# Patient Record
Sex: Male | Born: 1980 | Race: White | Hispanic: No | Marital: Married | Smoking: Never smoker
Health system: Southern US, Community
[De-identification: ages and names within clinical notes are randomized; demographics above are authoritative.]

## PROBLEM LIST (undated history)

## (undated) DIAGNOSIS — J45909 Unspecified asthma, uncomplicated: Secondary | ICD-10-CM

---

## 2011-11-06 ENCOUNTER — Emergency Department: Payer: Self-pay | Admitting: Emergency Medicine

## 2011-11-06 LAB — BASIC METABOLIC PANEL
Anion Gap: 9 (ref 7–16)
BUN: 17 mg/dL (ref 7–18)
Chloride: 105 mmol/L (ref 98–107)
Co2: 26 mmol/L (ref 21–32)
Creatinine: 1.04 mg/dL (ref 0.60–1.30)
EGFR (African American): >60
Glucose: 83 mg/dL (ref 65–99)
Potassium: 3.9 mmol/L (ref 3.5–5.1)

## 2011-11-06 LAB — CBC
HCT: 44.3 % (ref 40.0–52.0)
HGB: 15.2 g/dL (ref 13.0–18.0)
MCH: 30.7 pg (ref 26.0–34.0)
MCHC: 34.4 g/dL (ref 32.0–36.0)
MCV: 89 fL (ref 80–100)
Platelet: 228 10*3/uL (ref 150–440)
RDW: 12.7 % (ref 11.5–14.5)

## 2013-02-15 ENCOUNTER — Emergency Department: Payer: Self-pay | Admitting: Emergency Medicine

## 2013-02-15 LAB — COMPREHENSIVE METABOLIC PANEL
ALK PHOS: 119 U/L — AB
Albumin: 3.9 g/dL (ref 3.4–5.0)
Anion Gap: 4 — ABNORMAL LOW (ref 7–16)
BUN: 15 mg/dL (ref 7–18)
Bilirubin,Total: 0.4 mg/dL (ref 0.2–1.0)
CHLORIDE: 101 mmol/L (ref 98–107)
CO2: 26 mmol/L (ref 21–32)
Calcium, Total: 8.9 mg/dL (ref 8.5–10.1)
Creatinine: 0.75 mg/dL (ref 0.60–1.30)
EGFR (African American): >60
EGFR (Non-African Amer.): >60
GLUCOSE: 99 mg/dL (ref 65–99)
OSMOLALITY: 264 (ref 275–301)
POTASSIUM: 4 mmol/L (ref 3.5–5.1)
SGOT(AST): 44 U/L — ABNORMAL HIGH (ref 15–37)
SGPT (ALT): 55 U/L (ref 12–78)
SODIUM: 131 mmol/L — AB (ref 136–145)
TOTAL PROTEIN: 7.9 g/dL (ref 6.4–8.2)

## 2013-02-15 LAB — CBC WITH DIFFERENTIAL/PLATELET
Basophil #: 0 10*3/uL (ref 0.0–0.1)
Basophil %: 0.4 %
Eosinophil #: 0.1 10*3/uL (ref 0.0–0.7)
Eosinophil %: 1 %
HCT: 45.9 % (ref 40.0–52.0)
HGB: 15.8 g/dL (ref 13.0–18.0)
LYMPHS PCT: 30 %
Lymphocyte #: 2.7 10*3/uL (ref 1.0–3.6)
MCH: 30 pg (ref 26.0–34.0)
MCHC: 34.4 g/dL (ref 32.0–36.0)
MCV: 87 fL (ref 80–100)
MONO ABS: 0.5 x10 3/mm (ref 0.2–1.0)
Monocyte %: 5.6 %
Neutrophil #: 5.7 10*3/uL (ref 1.4–6.5)
Neutrophil %: 63 %
PLATELETS: 237 10*3/uL (ref 150–440)
RBC: 5.26 10*6/uL (ref 4.40–5.90)
RDW: 12.8 % (ref 11.5–14.5)
WBC: 9 10*3/uL (ref 3.8–10.6)

## 2013-02-15 LAB — URINALYSIS, COMPLETE
BACTERIA: NONE SEEN
BILIRUBIN, UR: NEGATIVE
Blood: NEGATIVE
Glucose,UR: NEGATIVE mg/dL (ref 0–75)
KETONE: NEGATIVE
Leukocyte Esterase: NEGATIVE
NITRITE: NEGATIVE
PROTEIN: NEGATIVE
Ph: 6 (ref 4.5–8.0)
SPECIFIC GRAVITY: 1.023 (ref 1.003–1.030)
SQUAMOUS EPITHELIAL: NONE SEEN

## 2013-02-15 LAB — LIPASE, BLOOD: LIPASE: 113 U/L (ref 73–393)

## 2016-01-15 DIAGNOSIS — Y9389 Activity, other specified: Secondary | ICD-10-CM | POA: Diagnosis not present

## 2016-01-15 DIAGNOSIS — Z23 Encounter for immunization: Secondary | ICD-10-CM | POA: Insufficient documentation

## 2016-01-15 DIAGNOSIS — S61237A Puncture wound without foreign body of left little finger without damage to nail, initial encounter: Secondary | ICD-10-CM | POA: Diagnosis not present

## 2016-01-15 DIAGNOSIS — Y929 Unspecified place or not applicable: Secondary | ICD-10-CM | POA: Diagnosis not present

## 2016-01-15 DIAGNOSIS — Y990 Civilian activity done for income or pay: Secondary | ICD-10-CM | POA: Insufficient documentation

## 2016-01-15 DIAGNOSIS — W311XXA Contact with metalworking machines, initial encounter: Secondary | ICD-10-CM | POA: Diagnosis not present

## 2016-01-15 DIAGNOSIS — S6992XA Unspecified injury of left wrist, hand and finger(s), initial encounter: Secondary | ICD-10-CM | POA: Diagnosis present

## 2016-01-16 ENCOUNTER — Emergency Department: Payer: Worker's Compensation

## 2016-01-16 ENCOUNTER — Encounter: Payer: Self-pay | Admitting: Emergency Medicine

## 2016-01-16 ENCOUNTER — Emergency Department
Admission: EM | Admit: 2016-01-16 | Discharge: 2016-01-16 | Disposition: A | Discharge status: Home / Self Care | Payer: Worker's Compensation | Attending: Emergency Medicine | Admitting: Emergency Medicine

## 2016-01-16 DIAGNOSIS — T1490XA Injury, unspecified, initial encounter: Secondary | ICD-10-CM

## 2016-01-16 DIAGNOSIS — T148XXA Other injury of unspecified body region, initial encounter: Secondary | ICD-10-CM

## 2016-01-16 DIAGNOSIS — S6992XA Unspecified injury of left wrist, hand and finger(s), initial encounter: Secondary | ICD-10-CM

## 2016-01-16 MED ORDER — CLINDAMYCIN HCL 300 MG PO CAPS: 300.0000 mg | ORAL_CAPSULE | Freq: Three times a day (TID) | ORAL | 0 refills | Status: AC

## 2016-01-16 MED ORDER — TETANUS-DIPHTH-ACELL PERTUSSIS 5-2.5-18.5 LF-MCG/0.5 IM SUSP
0.5000 mL | Freq: Once | INTRAMUSCULAR | Status: AC
  Administered 2016-01-16: 0.5 mL via INTRAMUSCULAR
  Filled 2016-01-16: qty 0.5

## 2016-01-16 MED ORDER — CLINDAMYCIN HCL 150 MG PO CAPS
300.0000 mg | ORAL_CAPSULE | Freq: Once | ORAL | Status: AC
  Administered 2016-01-16: 300 mg via ORAL
  Filled 2016-01-16: qty 2

## 2016-01-16 MED ORDER — IBUPROFEN 600 MG PO TABS
600.0000 mg | ORAL_TABLET | Freq: Once | ORAL | Status: AC
  Administered 2016-01-16: 600 mg via ORAL
  Filled 2016-01-16: qty 1

## 2016-01-16 MED ORDER — TRAMADOL HCL 50 MG PO TABS: 50.0000 mg | ORAL_TABLET | Freq: Four times a day (QID) | ORAL | 0 refills | Status: AC | PRN

## 2016-01-16 NOTE — ED Triage Notes (Signed)
Patient ambulatory to triage with steady gait, without difficulty or distress noted; pt reports PTA while at work, drill bit went thru left 5th digit; employeed with [employer]; workers comp profile indicates UDS required; [place], EDT to triage to complete profile

## 2016-01-16 NOTE — ED Provider Notes (Signed)
[facility] Emergency Department Provider Note   ____________________________________________   First MD Initiated Contact with Patient 01/16/16 0130     (approximate)  I have reviewed the triage vital signs and the nursing notes.   HISTORY  Chief Complaint Finger Injury    HPI Gerald Herrera is a 35 y.o. male who comes into the hospital today with injury to his finger. The patient reports that he put a drill bit into his finger. He was drilling a piece of metal at work and he thought his finger was out of the way. The patient reports that he made a mistake. He rates his pain a 7 out of 10 in intensity. The injuries to his left fifth digit. He does have some numbness and tingling and did not take anything for pain prior to arrival. He is here for evaluation.   History reviewed. No pertinent past medical history.  There are no active problems to display for this patient.   History reviewed. No pertinent surgical history.  Prior to Admission medications   Medication Sig Start Date End Date Taking? Authorizing Provider  clindamycin (CLEOCIN) 300 MG capsule Take 1 capsule (300 mg total) by mouth 3 (three) times daily. 01/16/16 01/26/16  Rebecka ApleyAllison P Ailen Strauch, MD  traMADol (ULTRAM) 50 MG tablet Take 1 tablet (50 mg total) by mouth every 6 (six) hours as needed. 01/16/16   Rebecka ApleyAllison P Kaisa Wofford, MD    Allergies Patient has no known allergies.  No family history on file.  Social History Social History  Substance Use Topics  . Smoking status: Never Smoker  . Smokeless tobacco: Never Used  . Alcohol use No    Review of Systems Constitutional: No fever/chills Eyes: No visual changes. ENT: No sore throat. Cardiovascular: Denies chest pain. Respiratory: Denies shortness of breath. Gastrointestinal: No abdominal pain.  No nausea, no vomiting.  No diarrhea.  No constipation. Genitourinary: Negative for dysuria. Musculoskeletal: left 5th digit pain and  injury Skin: Negative for rash. Neurological: Negative for headaches, focal weakness or numbness.  10-point ROS otherwise negative.  ____________________________________________   PHYSICAL EXAM:  VITAL SIGNS: ED Triage Vitals  Enc Vitals Group     BP 01/16/16 0000 140/80     Pulse Rate 01/16/16 0000 84     Resp 01/16/16 0000 20     Temp 01/16/16 0000 98.8 F (37.1 C)     Temp Source 01/16/16 0000 Oral     SpO2 01/16/16 0000 96 %     Weight 01/16/16 0001 250 lb (113.4 kg)     Height 01/16/16 0001 5\' 6"  (1.676 m)     Head Circumference --      Peak Flow --      Pain Score 01/16/16 0005 7     Pain Loc --      Pain Edu? --      Excl. in GC? --     Constitutional: Alert and oriented. Well appearing and in mild distress. Eyes: Conjunctivae are normal. PERRL. EOMI. Head: Atraumatic. Nose: No congestion/rhinnorhea. Mouth/Throat: Mucous membranes are moist.  Oropharynx non-erythematous. Cardiovascular: Normal rate, regular rhythm. Grossly normal heart sounds.  Good peripheral circulation. Respiratory: Normal respiratory effort.  No retractions. Lungs CTAB. Gastrointestinal: Soft and nontender. No distention. Positive bowel sounds Musculoskeletal: No lower extremity tenderness nor edema.  Moderate size puncture to mid 5th digit Neurologic:  Normal speech and language.  Skin:  Skin is warm, dry and intact.  Psychiatric: Mood and affect are normal.   ____________________________________________  LABS (all labs ordered are listed, but only abnormal results are displayed)  Labs Reviewed - No data to display ____________________________________________  EKG  none ____________________________________________  RADIOLOGY  Left finger xray ____________________________________________   PROCEDURES  Procedure(s) performed: None  Procedures  Critical Care performed: No  ____________________________________________   INITIAL IMPRESSION / ASSESSMENT AND PLAN / ED  COURSE  Pertinent labs & imaging results that were available during my care of the patient were reviewed by me and considered in my medical decision making (see chart for details).  This is a 35 year old male who comes into the hospital today after drilling his finger at work. As it is a puncture wound we'll wash it out but we will not suture it. The patient's tetanus is out of date so I will give him a tetanus shot. I will also give the patient some ibuprofen. Given again that this is a puncture wound allergies the patient with clindamycin and have her follow-up with his primary care physician. Patient has no further questions or concerns and he will be discharged.  Clinical Course as of Jan 16 616  Wed Jan 16, 2016  0105 Negative. DG Finger Little Left [AW]    Clinical Course User Index [AW] Rebecka ApleyAllison P Amaiah Cristiano, MD     ____________________________________________   FINAL CLINICAL IMPRESSION(S) / ED DIAGNOSES  Final diagnoses:  Injury  Injury of finger of left hand, initial encounter  Puncture wound      NEW MEDICATIONS STARTED DURING THIS VISIT:  Discharge Medication List as of 01/16/2016  2:54 AM    START taking these medications   Details  clindamycin (CLEOCIN) 300 MG capsule Take 1 capsule (300 mg total) by mouth 3 (three) times daily., Starting Wed 01/16/2016, Until Sat 01/26/2016, Print    traMADol (ULTRAM) 50 MG tablet Take 1 tablet (50 mg total) by mouth every 6 (six) hours as needed., Starting Wed 01/16/2016, Print         Note:  This document was prepared using Dragon voice recognition software and may include unintentional dictation errors.    Rebecka ApleyAllison P Acie Custis, MD 01/16/16 [phone]

## 2016-01-16 NOTE — Discharge Instructions (Signed)
Please monitor the wound for signs of infection such as redness, drainage, increased pain. Return with any of these signs and symptoms. Please otherwise follow-up with the acute care clinic.

## 2016-07-31 ENCOUNTER — Emergency Department
Admission: EM | Admit: 2016-07-31 | Discharge: 2016-07-31 | Disposition: A | Discharge status: Home / Self Care | Payer: Managed Care, Other (non HMO) | Attending: Emergency Medicine | Admitting: Emergency Medicine

## 2016-07-31 ENCOUNTER — Emergency Department: Payer: Managed Care, Other (non HMO)

## 2016-07-31 DIAGNOSIS — R109 Unspecified abdominal pain: Secondary | ICD-10-CM | POA: Diagnosis not present

## 2016-07-31 DIAGNOSIS — J45909 Unspecified asthma, uncomplicated: Secondary | ICD-10-CM | POA: Insufficient documentation

## 2016-07-31 DIAGNOSIS — Z79899 Other long term (current) drug therapy: Secondary | ICD-10-CM | POA: Insufficient documentation

## 2016-07-31 DIAGNOSIS — R079 Chest pain, unspecified: Secondary | ICD-10-CM | POA: Diagnosis present

## 2016-07-31 HISTORY — DX: Unspecified asthma, uncomplicated: J45.909

## 2016-07-31 LAB — BASIC METABOLIC PANEL
Anion gap: 5 (ref 5–15)
BUN: 22 mg/dL — AB (ref 6–20)
CALCIUM: 8.8 mg/dL — AB (ref 8.9–10.3)
CHLORIDE: 107 mmol/L (ref 101–111)
CO2: 22 mmol/L (ref 22–32)
CREATININE: 0.8 mg/dL (ref 0.61–1.24)
GFR calc Af Amer: >60 mL/min (ref >60)
Glucose, Bld: 98 mg/dL (ref 65–99)
Potassium: 4.5 mmol/L (ref 3.5–5.1)
SODIUM: 134 mmol/L — AB (ref 135–145)

## 2016-07-31 LAB — CBC
HCT: 42.4 % (ref 40.0–52.0)
Hemoglobin: 14.7 g/dL (ref 13.0–18.0)
MCH: 30.4 pg (ref 26.0–34.0)
MCHC: 34.7 g/dL (ref 32.0–36.0)
MCV: 87.7 fL (ref 80.0–100.0)
Platelets: 203 10*3/uL (ref 150–440)
RBC: 4.83 MIL/uL (ref 4.40–5.90)
RDW: 13.2 % (ref 11.5–14.5)
WBC: 7.1 10*3/uL (ref 3.8–10.6)

## 2016-07-31 LAB — TROPONIN I: Troponin I: <0.03 ng/mL (ref <0.03)

## 2016-07-31 MED ORDER — MORPHINE SULFATE (PF) 4 MG/ML IV SOLN
4.0000 mg | Freq: Once | INTRAVENOUS | Status: AC
  Administered 2016-07-31: 4 mg via INTRAVENOUS
  Filled 2016-07-31: qty 1

## 2016-07-31 MED ORDER — IOPAMIDOL (ISOVUE-370) INJECTION 76%
100.0000 mL | Freq: Once | INTRAVENOUS | Status: AC | PRN
  Administered 2016-07-31: 100 mL via INTRAVENOUS

## 2016-07-31 MED ORDER — ONDANSETRON HCL 4 MG/2ML IJ SOLN
4.0000 mg | Freq: Once | INTRAMUSCULAR | Status: AC
  Administered 2016-07-31: 4 mg via INTRAVENOUS
  Filled 2016-07-31: qty 2

## 2016-07-31 NOTE — ED Notes (Signed)
Pt alert and oriented X4, active, cooperative, pt in NAD. RR even and unlabored, color WNL.  Pt informed to return if any life threatening symptoms occur.   

## 2016-07-31 NOTE — Discharge Instructions (Addendum)
You were evaluated for chest discomfort, and as we discussed although no certain cause was found I am most suspicious of likely musculoskeletal chest wall pain.  You may continue to take ibuprofen 800 mg every 8 hours as needed for discomfort. Return to the emergency department immediately for any worsening chest pain, especially any associated nausea, sweats, dizziness, weakness, numbness, trouble breathing, passing out, or any other symptoms concerning to you.  I am recommending he follow-up with primary care doctor as well as cardiologist.

## 2016-07-31 NOTE — ED Notes (Signed)
Pt reports that he developed chest pain to center of chest yesterday while at work. Pt was not doing anything abnormal while at work when chest pain developed. Describes pain as ripping pain. When pain initially started he reports diaphoresis and fatigue. Pain continues but intermittently gets 9/10. Pt alert and oriented X4, active, cooperative, pt in NAD. RR even and unlabored, color WNL.

## 2016-07-31 NOTE — ED Triage Notes (Signed)
Pt arrived via POV - drove himself to the ED. Pt reports chest pain that started yesterday afternoon while he was working in the heat. States he was doing anything strenuous at the time. States he feels a pulling sensation in his chest that radiates to the right shoulder.  Also c/o feeling weak and lightheaded. Pt reports the pain worsened overnight.

## 2016-07-31 NOTE — ED Provider Notes (Signed)
[facility] Emergency Department Provider Note ____________________________________________   I have reviewed the triage vital signs and the triage nursing note.  HISTORY  Chief Complaint Chest Pain   Historian Patient  HPI Gerald Herrera is a 36 y.o. male without significant known past medical history, presents with right-sided chest discomfort that started yesterday late afternoon at work, nonexertional. No known traumatic or overuse injury. He's never had this before. Pain has been somewhat gradual onset to maximal intensity overnight when he states the pain was at 1.26 out of 10. It's been there at a constant low level waxing and waning from there. Currently about 9 out of 10. Denies nausea or vomiting or diarrhea. Denies abdominal pain. Denies coughing or trouble breathing. States the pain was a little worse when lying flat. Denies numbness or weakness or pain into the arm or jaw.  No known cardiac history. No confusion altered mental status. No neurologic symptoms.    Past Medical History:  Diagnosis Date  . Asthma     There are no active problems to display for this patient.   No past surgical history on file.  Prior to Admission medications   Medication Sig Start Date End Date Taking? Authorizing Provider  traMADol (ULTRAM) 50 MG tablet Take 1 tablet (50 mg total) by mouth every 6 (six) hours as needed. 01/16/16   Rebecka Apley, MD    No Known Allergies  No family history on file.  Social History Social History  Substance Use Topics  . Smoking status: Never Smoker  . Smokeless tobacco: Never Used  . Alcohol use No    Review of Systems  Constitutional: Negative for fever. Eyes: Negative for visual changes. ENT: Negative for sore throat. Cardiovascular: Positive for chest pain. Respiratory: Negative for shortness of breath. Gastrointestinal: Negative for abdominal pain, vomiting and diarrhea. Genitourinary: Negative for  dysuria. Musculoskeletal: Negative for back pain. Skin: Negative for rash. Neurological: Negative for headache.  ____________________________________________   PHYSICAL EXAM:  VITAL SIGNS: ED Triage Vitals  Enc Vitals Group     BP 07/31/16 1014 (!) 109/50     Pulse Rate 07/31/16 1014 70     Resp 07/31/16 1014 18     Temp 07/31/16 1014 97.7 F (36.5 C)     Temp Source 07/31/16 1014 Oral     SpO2 07/31/16 1014 98 %     Weight 07/31/16 1015 240 lb (108.9 kg)     Height 07/31/16 1015 5\' 7"  (1.702 m)     Head Circumference --      Peak Flow --      Pain Score 07/31/16 1014 7     Pain Loc --      Pain Edu? --      Excl. in GC? --      Constitutional: Alert and oriented. Well appearing and in no distress. HEENT   Head: Normocephalic and atraumatic.      Eyes: Conjunctivae are normal. Pupils equal and round.       Ears:         Nose: No congestion/rhinnorhea.   Mouth/Throat: Mucous membranes are moist.   Neck: No stridor. Cardiovascular/Chest: Normal rate, regular rhythm.  No murmurs, rubs, or gallops.  Right pectoral muscle is somewhat tender to palpation. Respiratory: Normal respiratory effort without tachypnea nor retractions. Breath sounds are clear and equal bilaterally. No wheezes/rales/rhonchi. Gastrointestinal: Soft. No distention, no guarding, no rebound. Nontender.    Genitourinary/rectal:Deferred Musculoskeletal: Nontender with normal range of motion in all  extremities. No joint effusions.  No lower extremity tenderness.  No edema. Neurologic:  Normal speech and language. No gross or focal neurologic deficits are appreciated. Skin:  Skin is warm, dry and intact. No rash noted. Psychiatric: Mood and affect are normal. Speech and behavior are normal. Patient exhibits appropriate insight and judgment.   ____________________________________________  LABS (pertinent positives/negatives)  Labs Reviewed  BASIC METABOLIC PANEL - Abnormal; Notable for the  following:       Result Value   Sodium 134 (*)    BUN 22 (*)    Calcium 8.8 (*)    All other components within normal limits  CBC  TROPONIN I    ____________________________________________    EKG I, Governor Rooksebecca Sreshta Cressler, MD, the attending physician have personally viewed and interpreted all ECGs.  76 bpm. Normal sinus rhythm. Narrow QRS. Normal axis. Normal ST and T-wave. No S1 q3 T3. ____________________________________________  RADIOLOGY All Xrays were viewed by me. Imaging interpreted by Radiologist.  Chest x-ray two-view: No active cardiopulmonary disease.  CT angios chest abdomen pelvis:  IMPRESSION: CT angiogram chest: No thoracic aortic aneurysm or dissection. No pulmonary embolus evident. Lungs clear. No evident adenopathy.  CT angiogram abdomen and CT angiogram pelvis: No aneurysm or dissection involving the aorta, major mesenteric, and major pelvic arterial vascular structures. No appreciable atherosclerotic change in the of these vessels.  No bowel wall thickening or bowel obstruction. No abscess. Appendix appears normal. No renal or ureteral calculus. No hydronephrosis.  Small ventral hernia containing only fat.  __________________________________________  PROCEDURES  Procedure(s) performed: None  Critical Care performed: None  ____________________________________________   ED COURSE / ASSESSMENT AND PLAN  Pertinent labs & imaging results that were available during my care of the patient were reviewed by me and considered in my medical decision making (see chart for details).   [name] is here for right-sided chest discomfort, without associated symptoms, but fairly significant in intensity. His EKG and troponin are negative and reassuring and I'm less suspicious for ACS in this 36 year old with a reassuring workup, and some evidence for musculoskeletal source given tenderness to palpation of his right pectoral muscle, however I am going to go  ahead and refer him for cardiology follow-up.  We discussed given the persistent severe discomfort, risk versus benefit for evaluation of the aorta with CT scan and chose to proceed in shared decision-making.   CT is reassuring for no emergency medical condition. It patient okay for discharge home at this point after speaking with the cardiologist on call, I'm most suspicious about chest wall pain, but am still going to refer him.    CONSULTATIONS: I spoke with cardiologist, Dr. Juliann Paresallwood who recommends outpatient follow-up and is happy to see him in clinic.   Patient / Family / Caregiver informed of clinical course, medical decision-making process, and agree with plan.   I discussed return precautions, follow-up instructions, and discharge instructions with patient and/or family.  Discharge Instructions : You were evaluated for chest discomfort, and as we discussed although no certain cause was found I am most suspicious of likely musculoskeletal chest wall pain.  You may continue to take ibuprofen 800 mg every 8 hours as needed for discomfort. Return to the emergency department immediately for any worsening chest pain, especially any associated nausea, sweats, dizziness, weakness, numbness, trouble breathing, passing out, or any other symptoms concerning to you.  I am recommending he follow-up with primary care doctor as well as cardiologist.  ___________________________________________   FINAL CLINICAL IMPRESSION(S) /  ED DIAGNOSES   Final diagnoses:  Nonspecific chest pain              Note: This dictation was prepared with Dragon dictation. Any transcriptional errors that result from this process are unintentional    Governor Rooks, MD 07/31/16 1539

## 2017-10-26 IMAGING — CT CT ANGIO CHEST-ABD-PELV FOR DISSECTION W/ AND WO/W CM
2 of 7 series · 13 of 46 positions shown, 15 images · IV contrast (APPLIED)
Comparison: Chest CT November 06, 2011; CT abdomen and pelvis
February 16, 2013; chest radiograph July 31, 2016

CLINICAL DATA: Chest and abdominal pain

EXAM:
CT ANGIOGRAPHY CHEST, ABDOMEN AND PELVIS
TECHNIQUE: Initially, axial CT images were obtained through the chest without
intravenous contrast material administration. Multidetector CT
imaging through the chest, abdomen and pelvis was performed using
the standard protocol during bolus administration of intravenous
contrast. Multiplanar reconstructed images and MIPs were obtained
and reviewed to evaluate the vascular anatomy.
CONTRAST:  100 mL Isovue 370 nonionic

[Series 5: axial arterial · axial · arterial · 0.75mm/px · z∈[-1020,-423]mm · 10 of 223 slices shown, 12 images]
[im 12/223  soft-tissue]
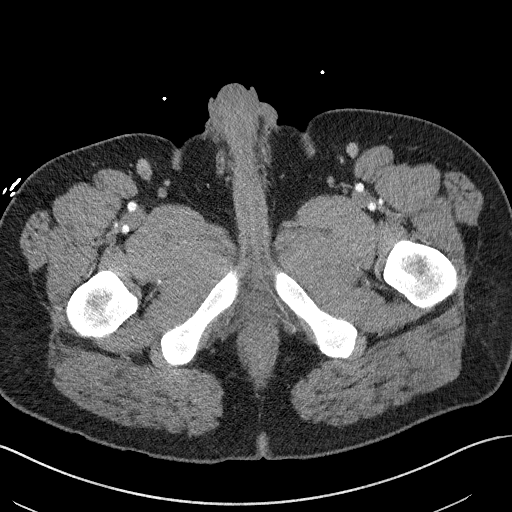
[im 12/223  bone]
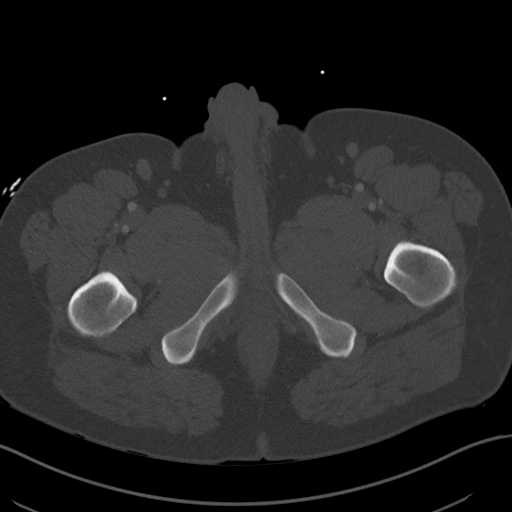
[im 34/223  soft-tissue]
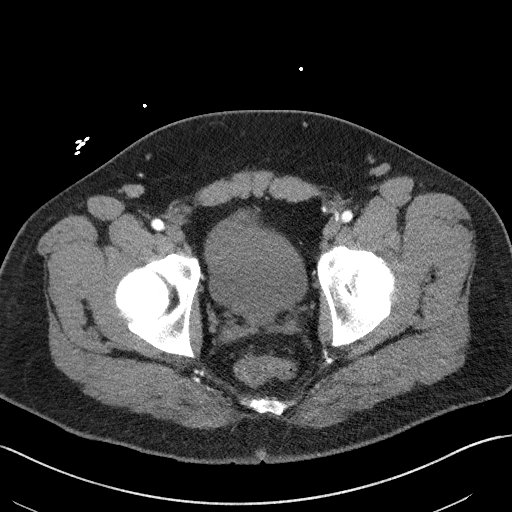
[im 56/223  soft-tissue]
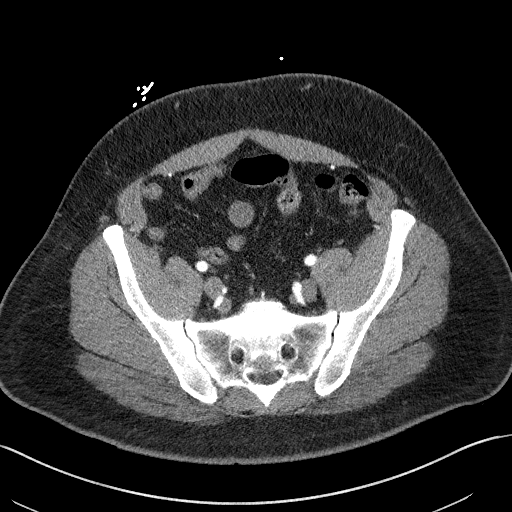
[im 78/223  soft-tissue]
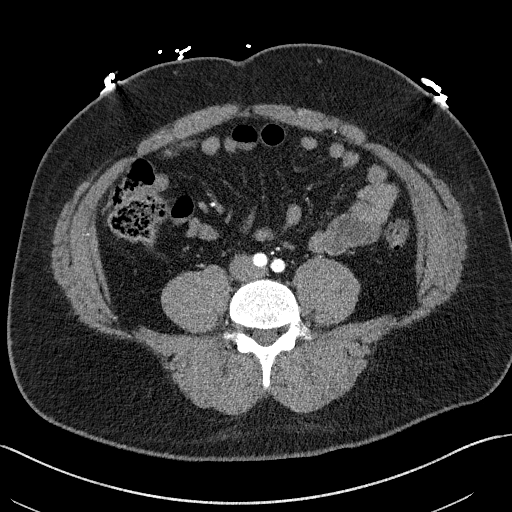
[im 100/223  soft-tissue]
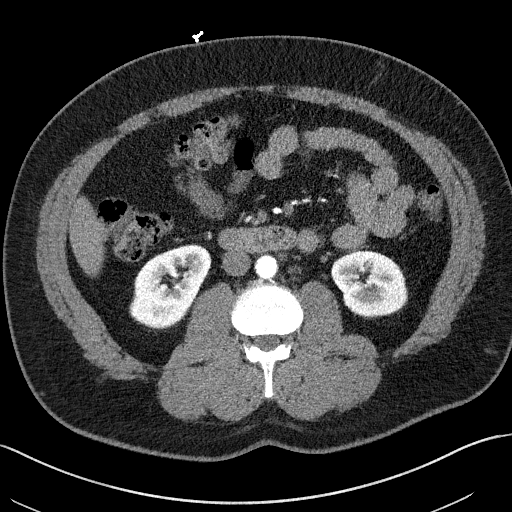
[im 123/223  soft-tissue]
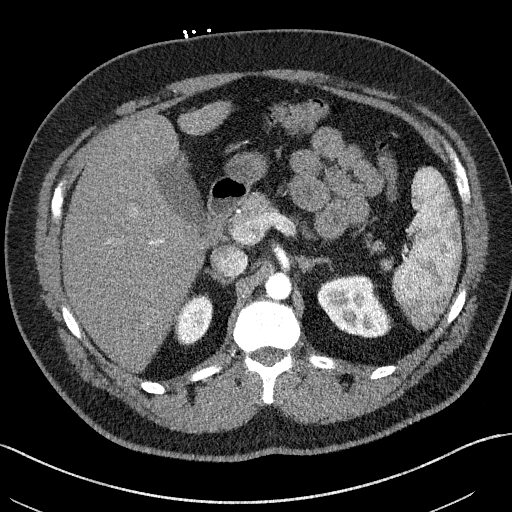
[im 145/223  soft-tissue]
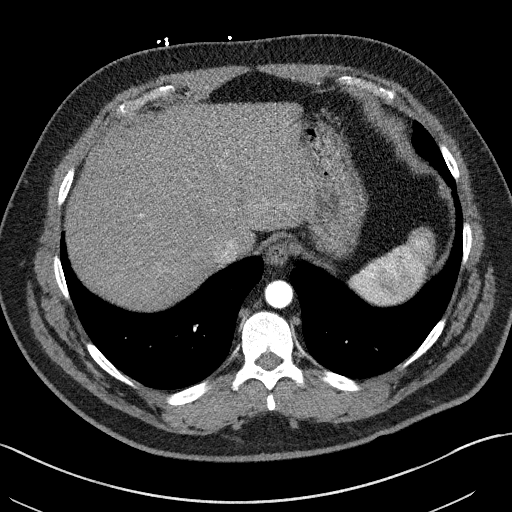
[im 167/223  soft-tissue]
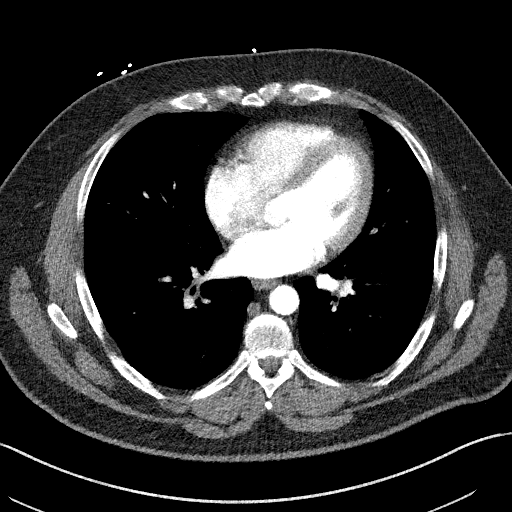
[im 189/223  soft-tissue]
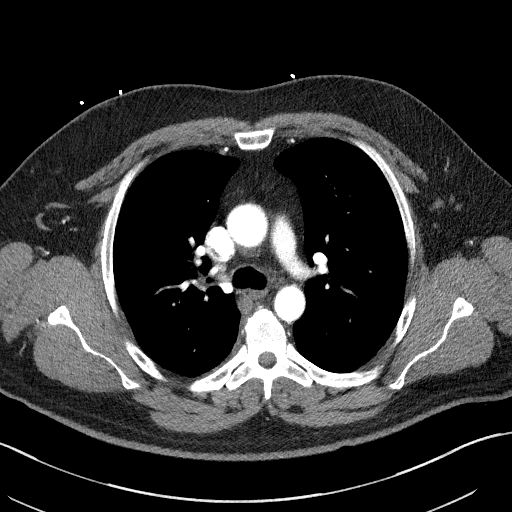
[im 189/223  bone]
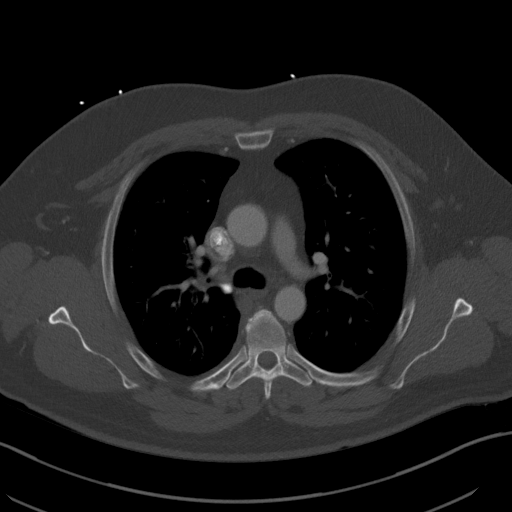
[im 211/223  soft-tissue]
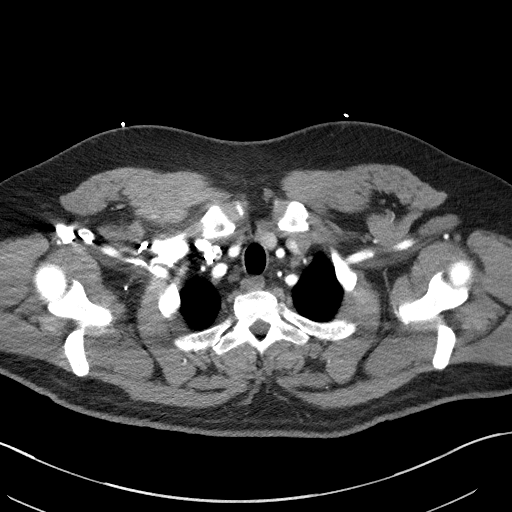

[Series 7: coronals · coronal · 0.86mm/px · 3 of 154 slices shown]
[im 39/154  soft-tissue]
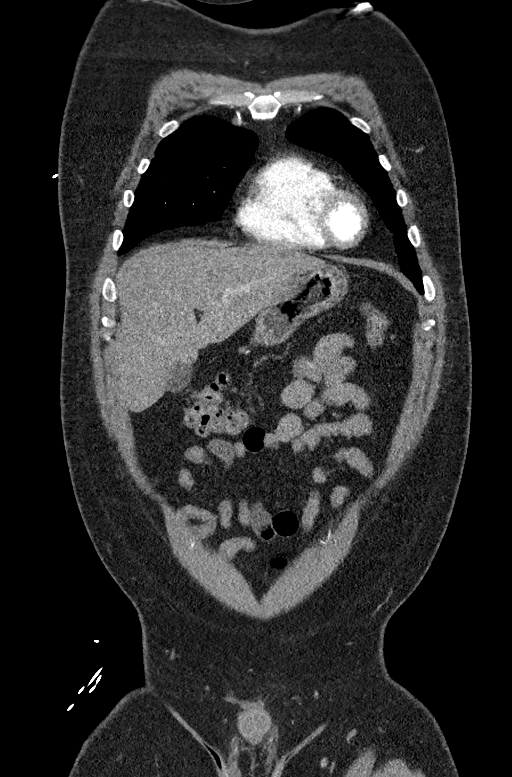
[im 77/154  soft-tissue]
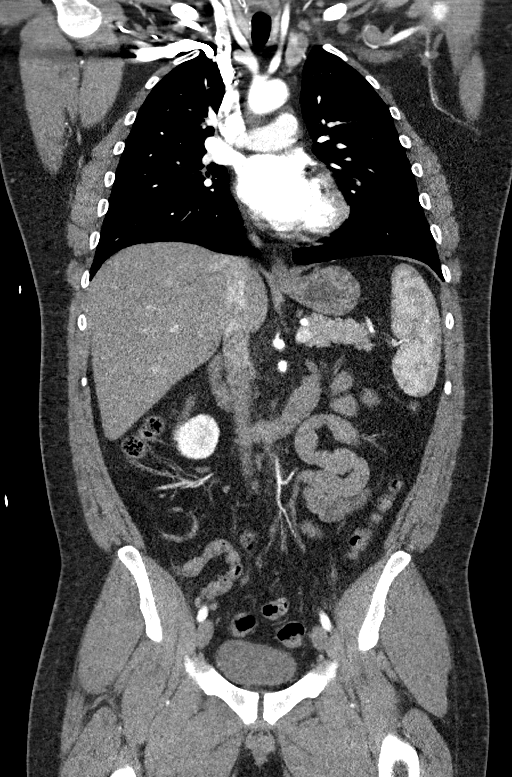
[im 115/154  soft-tissue]
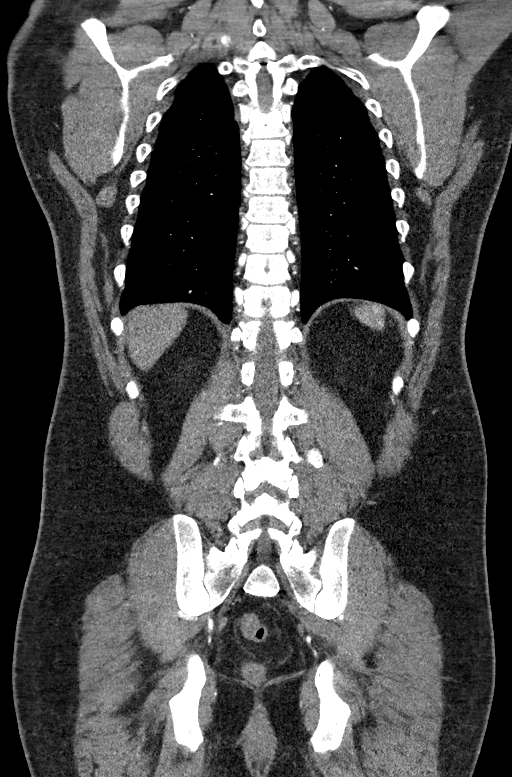

[13 of 46 positions shown; findings below may reference images not displayed]

FINDINGS: CTA CHEST FINDINGS

Cardiovascular: No intramural hematoma seen in the thoracic aorta on
noncontrast enhanced study. There is no appreciable thoracic aortic
aneurysm or dissection. The visualized great vessels appear normal.
Right and left common carotid arteries arise as common trunk, an
anatomic variant. There is no intramural hematoma. There is no
evidence penetrating ulcer or mucosal irregularity. No appreciable
vascular calcification. Pericardium is not thickened. There is no
major vessel pulmonary embolus.

Mediastinum/Nodes: Thyroid appears unremarkable. There is no
appreciable thoracic adenopathy.

Lungs/Pleura: There is no edema or consolidation. No pleural
effusion or pleural thickening evident.

Musculoskeletal: No blastic or lytic bone lesions. No fracture or
dislocation. No chest wall lesions evident.

Review of the MIP images confirms the above findings.

CTA ABDOMEN AND PELVIS FINDINGS

VASCULAR

Aorta: There is no abdominal aortic aneurysm or dissection. No
appreciable atherosclerotic change noted. No appreciable aortic
narrowing.

Celiac: Celiac artery and its major branches appear widely patent.
No aneurysm or dissection.

SMA: Superior mesenteric artery and its major branches appear widely
patent. No aneurysm or dissection.

Renals: There is a single renal artery on the right. There are 2
renal arteries arising from the aorta on the left. These vessels are
widely patent. No evidence of aneurysm, dissection, or fibromuscular
dysplasia.

IMA: Inferior mesenteric artery and its major branches appear widely
patent. No aneurysm or dissection.

Inflow: There is no aneurysm or dissection in the major pelvic
arterial vessels. There is no appreciable major pelvic vessel
narrowing or atherosclerotic calcification. The visualized
superficial and profunda femoral artery is likewise appear widely
patent without aneurysm or dissection.

Veins: No obvious venous abnormality within the limitations of this
arterial phase study.

Review of the MIP images confirms the above findings.

NON-VASCULAR

Hepatobiliary: No focal liver lesions are apparent. Gallbladder wall
is not appreciably thickened. There is no biliary duct dilatation.

Pancreas: No pancreatic mass or inflammatory focus.

Spleen: No splenic lesions are evident.

Adrenals/Urinary Tract: Adrenals appear normal bilaterally. Kidneys
bilaterally show no mass or hydronephrosis on either side. There is
no renal or ureteral calculus on either side. The urinary bladder is
midline with wall thickness within normal limits.

Stomach/Bowel: There is no bowel wall or mesenteric thickening.
There is no bowel obstruction. No free air or portal venous air.

Lymphatic: There is no appreciable adenopathy in the abdomen or
pelvis. There are stable subcentimeter inguinal lymph nodes
bilaterally regarded as nonspecific.

Reproductive: Prostate and seminal vesicles appear normal in size
and contour. No pelvic mass evident.

Other: Appendix appears normal. There is no ascites or abscess in
the abdomen or pelvis. There is a small ventral hernia containing
fat.

Musculoskeletal: There are no blastic or lytic bone lesions. No
fracture or dislocation.

Review of the MIP images confirms the above findings.
IMPRESSION: CT angiogram chest: No thoracic aortic aneurysm or dissection. No
pulmonary embolus evident. Lungs clear. No evident adenopathy.

CT angiogram abdomen and CT angiogram pelvis: No aneurysm or
dissection involving the aorta, major mesenteric, and major pelvic
arterial vascular structures. No appreciable atherosclerotic change
in the of these vessels.

No bowel wall thickening or bowel obstruction. No abscess. Appendix
appears normal. No renal or ureteral calculus. No hydronephrosis.

Small ventral hernia containing only fat.

## 2019-01-11 ENCOUNTER — Other Ambulatory Visit: Payer: Self-pay

## 2019-01-11 ENCOUNTER — Emergency Department: Payer: 59

## 2019-01-11 ENCOUNTER — Emergency Department
Admission: EM | Admit: 2019-01-11 | Discharge: 2019-01-11 | Disposition: A | Discharge status: Home / Self Care | Payer: 59 | Attending: Emergency Medicine | Admitting: Emergency Medicine

## 2019-01-11 ENCOUNTER — Encounter: Payer: Self-pay | Admitting: Emergency Medicine

## 2019-01-11 DIAGNOSIS — Z20828 Contact with and (suspected) exposure to other viral communicable diseases: Secondary | ICD-10-CM | POA: Insufficient documentation

## 2019-01-11 DIAGNOSIS — J45909 Unspecified asthma, uncomplicated: Secondary | ICD-10-CM | POA: Insufficient documentation

## 2019-01-11 DIAGNOSIS — R0789 Other chest pain: Secondary | ICD-10-CM | POA: Diagnosis not present

## 2019-01-11 DIAGNOSIS — K219 Gastro-esophageal reflux disease without esophagitis: Secondary | ICD-10-CM | POA: Insufficient documentation

## 2019-01-11 DIAGNOSIS — R079 Chest pain, unspecified: Secondary | ICD-10-CM | POA: Diagnosis present

## 2019-01-11 DIAGNOSIS — R0600 Dyspnea, unspecified: Secondary | ICD-10-CM | POA: Diagnosis not present

## 2019-01-11 LAB — CBC
HCT: 47.9 % (ref 39.0–52.0)
Hemoglobin: 15.9 g/dL (ref 13.0–17.0)
MCH: 30 pg (ref 26.0–34.0)
MCHC: 33.2 g/dL (ref 30.0–36.0)
MCV: 90.4 fL (ref 80.0–100.0)
Platelets: 247 10*3/uL (ref 150–400)
RBC: 5.3 MIL/uL (ref 4.22–5.81)
RDW: 12.2 % (ref 11.5–15.5)
WBC: 9.2 10*3/uL (ref 4.0–10.5)
nRBC: 0 % (ref 0.0–0.2)

## 2019-01-11 LAB — BASIC METABOLIC PANEL
Anion gap: 10 (ref 5–15)
BUN: 14 mg/dL (ref 6–20)
CO2: 27 mmol/L (ref 22–32)
Calcium: 9.6 mg/dL (ref 8.9–10.3)
Chloride: 100 mmol/L (ref 98–111)
Creatinine, Ser: 0.9 mg/dL (ref 0.61–1.24)
GFR calc Af Amer: >60 mL/min (ref >60)
GFR calc non Af Amer: >60 mL/min (ref >60)
Glucose, Bld: 84 mg/dL (ref 70–99)
Potassium: 3.9 mmol/L (ref 3.5–5.1)
Sodium: 137 mmol/L (ref 135–145)

## 2019-01-11 LAB — BRAIN NATRIURETIC PEPTIDE: B Natriuretic Peptide: 15 pg/mL (ref 0.0–100.0)

## 2019-01-11 LAB — TROPONIN I (HIGH SENSITIVITY)
Troponin I (High Sensitivity): 3 ng/L (ref <18)
Troponin I (High Sensitivity): 3 ng/L (ref <18)

## 2019-01-11 LAB — FIBRIN DERIVATIVES D-DIMER (ARMC ONLY): Fibrin derivatives D-dimer (ARMC): 233.24 ng/mL (FEU) (ref 0.00–499.00)

## 2019-01-11 MED ORDER — AZITHROMYCIN 250 MG PO TABS: ORAL_TABLET | ORAL | 0 refills | Status: DC

## 2019-01-11 MED ORDER — NICARDIPINE HCL IN NACL 20-0.86 MG/200ML-% IV SOLN: 0.0000 mg/h | INTRAVENOUS | Status: DC

## 2019-01-11 MED ORDER — OMEPRAZOLE 40 MG PO CPDR: 40.0000 mg | DELAYED_RELEASE_CAPSULE | Freq: Every day | ORAL | 1 refills | Status: DC

## 2019-01-11 NOTE — Discharge Instructions (Addendum)
Avoid alcohol and caffeine and chocolate for now.  Additionally you can try to elevate the head of the bed a few inches.  The easiest way to do that is to put some cinderblocks under the head of the bed frame.  This will help with the reflux and the tasting of your food that you ate when you lay down.  I will also give you some Zithromax for what sounds like a bronchitis.  2 on the first day and then 1 every day till are gone.  Just in case I want you to follow-up with the cardiologist.  Dr. Saunders Revel is on call.  If you give his office a call in the morning they should be out of work you went very quickly.  Please return for worsening symptoms of chest tightness or fever or shortness of breath.  The coronavirus test should be back tomorrow late in the evening possibly even in 2 days in the morning.

## 2019-01-11 NOTE — ED Provider Notes (Signed)
[facility] Emergency Department Provider Note   ____________________________________________   First MD Initiated Contact with Patient 01/11/19 1754     (approximate)  I have reviewed the triage vital signs and the nursing notes.   HISTORY  Chief Complaint Chest Pain and Shortness of Breath   HPI Gerald Herrera is a 38 y.o. male he reports yesterday when he laid down he began coughing and felt some postnasal drip with some coughing up of some phlegm that was thick and possibly yellowish.  He has some chest tightness at the bottom of the ribs possibly from coughing.  Laying down makes him worse walking does not do anything to the chest pain but makes his shortness of breath little worse.  Pain is worse with deep breathing.  He also reports that when he lays down flat he can taste the food he had for supper in his mouth occasionally.  He has no fever.  He is not had any problems before this.         Past Medical History:  Diagnosis Date  . Asthma     There are no active problems to display for this patient.   History reviewed. No pertinent surgical history.  Prior to Admission medications   Medication Sig Start Date End Date Taking? Authorizing Provider  azithromycin (ZITHROMAX Z-PAK) 250 MG tablet Take 2 tablets (500 mg) on  Day 1,  followed by 1 tablet (250 mg) once daily on Days 2 through 5. 01/11/19 01/16/19  Nena Polio, MD  omeprazole (PRILOSEC) 40 MG capsule Take 1 capsule (40 mg total) by mouth daily. 01/11/19 01/11/20  Nena Polio, MD  traMADol (ULTRAM) 50 MG tablet Take 1 tablet (50 mg total) by mouth every 6 (six) hours as needed. 01/16/16   Loney Hering, MD    Allergies Patient has no known allergies.  No family history on file.  Social History Social History   Tobacco Use  . Smoking status: Never Smoker  . Smokeless tobacco: Never Used  Substance Use Topics  . Alcohol use: No  . Drug use: Not on file    Review of  Systems  Constitutional: No fever/chills Eyes: No visual changes. ENT: No sore throat. Cardiovascular:chest pain. Respiratory:  shortness of breath. Gastrointestinal: No abdominal pain.  No nausea, no vomiting.  No diarrhea.  No constipation. Genitourinary: Negative for dysuria. Musculoskeletal: Negative for back pain. Skin: Negative for rash. Neurological: Negative for headaches, focal weakness   ____________________________________________   PHYSICAL EXAM:  VITAL SIGNS: ED Triage Vitals [01/11/19 1353]  Enc Vitals Group     BP (!) 144/101     Pulse Rate 72     Resp 20     Temp 98.2 F (36.8 C)     Temp Source Oral     SpO2 96 %     Weight 280 lb (127 kg)     Height 5\' 7"  (1.702 m)     Head Circumference      Peak Flow      Pain Score 5     Pain Loc      Pain Edu?      Excl. in [place]?     Constitutional: Alert and oriented. Well appearing and in no acute distress. Eyes: Conjunctivae are normal.  Head: Atraumatic. Nose: No congestion/rhinnorhea. Mouth/Throat: Mucous membranes are moist.  Oropharynx non-erythematous. Neck: No stridor.   Cardiovascular: Normal rate, regular rhythm. Grossly normal heart sounds.  Good peripheral circulation. Respiratory: Normal respiratory  effort.  No retractions. Lungs CTAB. Gastrointestinal: Soft and nontender. No distention. No abdominal bruits. No CVA tenderness. Musculoskeletal: No lower extremity tenderness nor edema.   Neurologic:  Normal speech and language. No gross focal neurologic deficits are appreciated. No gait instability. Skin:  Skin is warm, dry and intact. No rash noted. Psychiatric: Mood and affect are normal. Speech and behavior are normal.  ____________________________________________   LABS (all labs ordered are listed, but only abnormal results are displayed)  Labs Reviewed  SARS CORONAVIRUS 2 (TAT 6-24 HRS)  BASIC METABOLIC PANEL  CBC  BRAIN NATRIURETIC PEPTIDE  FIBRIN DERIVATIVES D-DIMER ([facility] ONLY)   TROPONIN I (HIGH SENSITIVITY)  TROPONIN I (HIGH SENSITIVITY)   ____________________________________________  EKG  EKG read interpreted by me shows normal sinus rhythm rate of 69 normal axis no acute ST-T wave changes __EKG #2 also shows normal sinus rhythm rate of 69 with no acute ST-T wave changes __________________________________________  RADIOLOGY  ED MD interpretation: Chest x-ray read by radiology reviewed by me is negative  Official radiology report(s): Dg Chest 2 View  Result Date: 01/11/2019 CLINICAL DATA:  Chest pain and shortness of breath EXAM: CHEST - 2 VIEW COMPARISON:  July 31, 2016 FINDINGS: No edema or consolidation. Heart size and pulmonary vascularity are within normal limits. No adenopathy. No bone lesions. IMPRESSION: No edema or consolidation.  No evident adenopathy. Electronically Signed   By: Bretta Bang III M.D.   On: 01/11/2019 14:24    ____________________________________________   PROCEDURES  Procedure(s) performed (including Critical Care):  Procedures   ____________________________________________   INITIAL IMPRESSION / ASSESSMENT AND PLAN / ED COURSE Patient's troponins are negative and stable BNP is negative white count is normal, D-dimer is normal.  Patient symptoms are suggestive of some bronchitis and/or reflux.  I will treat him for both have him follow-up with cardiology.  We are still waiting for the Covid test come back but that will not come back till tomorrow.          Clinical Course as of Jan 11 1952  Tue Jan 11, 2019  1841 GFR, Est Non African American: >60 [PM]    Clinical Course User Index [PM] Arnaldo Natal, MD     ____________________________________________   FINAL CLINICAL IMPRESSION(S) / ED DIAGNOSES  Final diagnoses:  Dyspnea, unspecified type  Atypical chest pain  Gastroesophageal reflux disease, unspecified whether esophagitis present     ED Discharge Orders         Ordered    azithromycin  (ZITHROMAX Z-PAK) 250 MG tablet     01/11/19 1953    omeprazole (PRILOSEC) 40 MG capsule  Daily     01/11/19 1953           Note:  This document was prepared using Dragon voice recognition software and may include unintentional dictation errors.    Arnaldo Natal, MD 01/11/19 [phone]

## 2019-01-11 NOTE — ED Triage Notes (Signed)
Pt reports CP that is heavy and pressure like in nature for a few days and SOB. Pt states at night he wakes up feeling like he is drowning and can't breathe.

## 2019-01-12 ENCOUNTER — Telehealth: Payer: Self-pay | Admitting: Emergency Medicine

## 2019-01-12 LAB — SARS CORONAVIRUS 2 (TAT 6-24 HRS): SARS Coronavirus 2: NEGATIVE

## 2019-01-12 MED ORDER — AZITHROMYCIN 250 MG PO TABS: ORAL_TABLET | ORAL | 0 refills | Status: AC

## 2019-01-12 MED ORDER — OMEPRAZOLE 40 MG PO CPDR: 40.0000 mg | DELAYED_RELEASE_CAPSULE | Freq: Every day | ORAL | 0 refills | Status: AC

## 2019-01-12 NOTE — Telephone Encounter (Signed)
Patient did not receive his paper rx yesterday during visit with Dr. Cinda Quest. I was asked to call these in to preferred pharmacy. No actual telephone call required.

## 2020-04-07 IMAGING — CR DG CHEST 2V
1 series · 2 of 2 positions shown · non-contrast
Comparison: July 31, 2016

CLINICAL DATA: Chest pain and shortness of breath

EXAM:
CHEST - 2 VIEW

[Series 1: dg chest 2 view · 0.14mm/px · 2 of 2 slices shown]
[im 1/2]
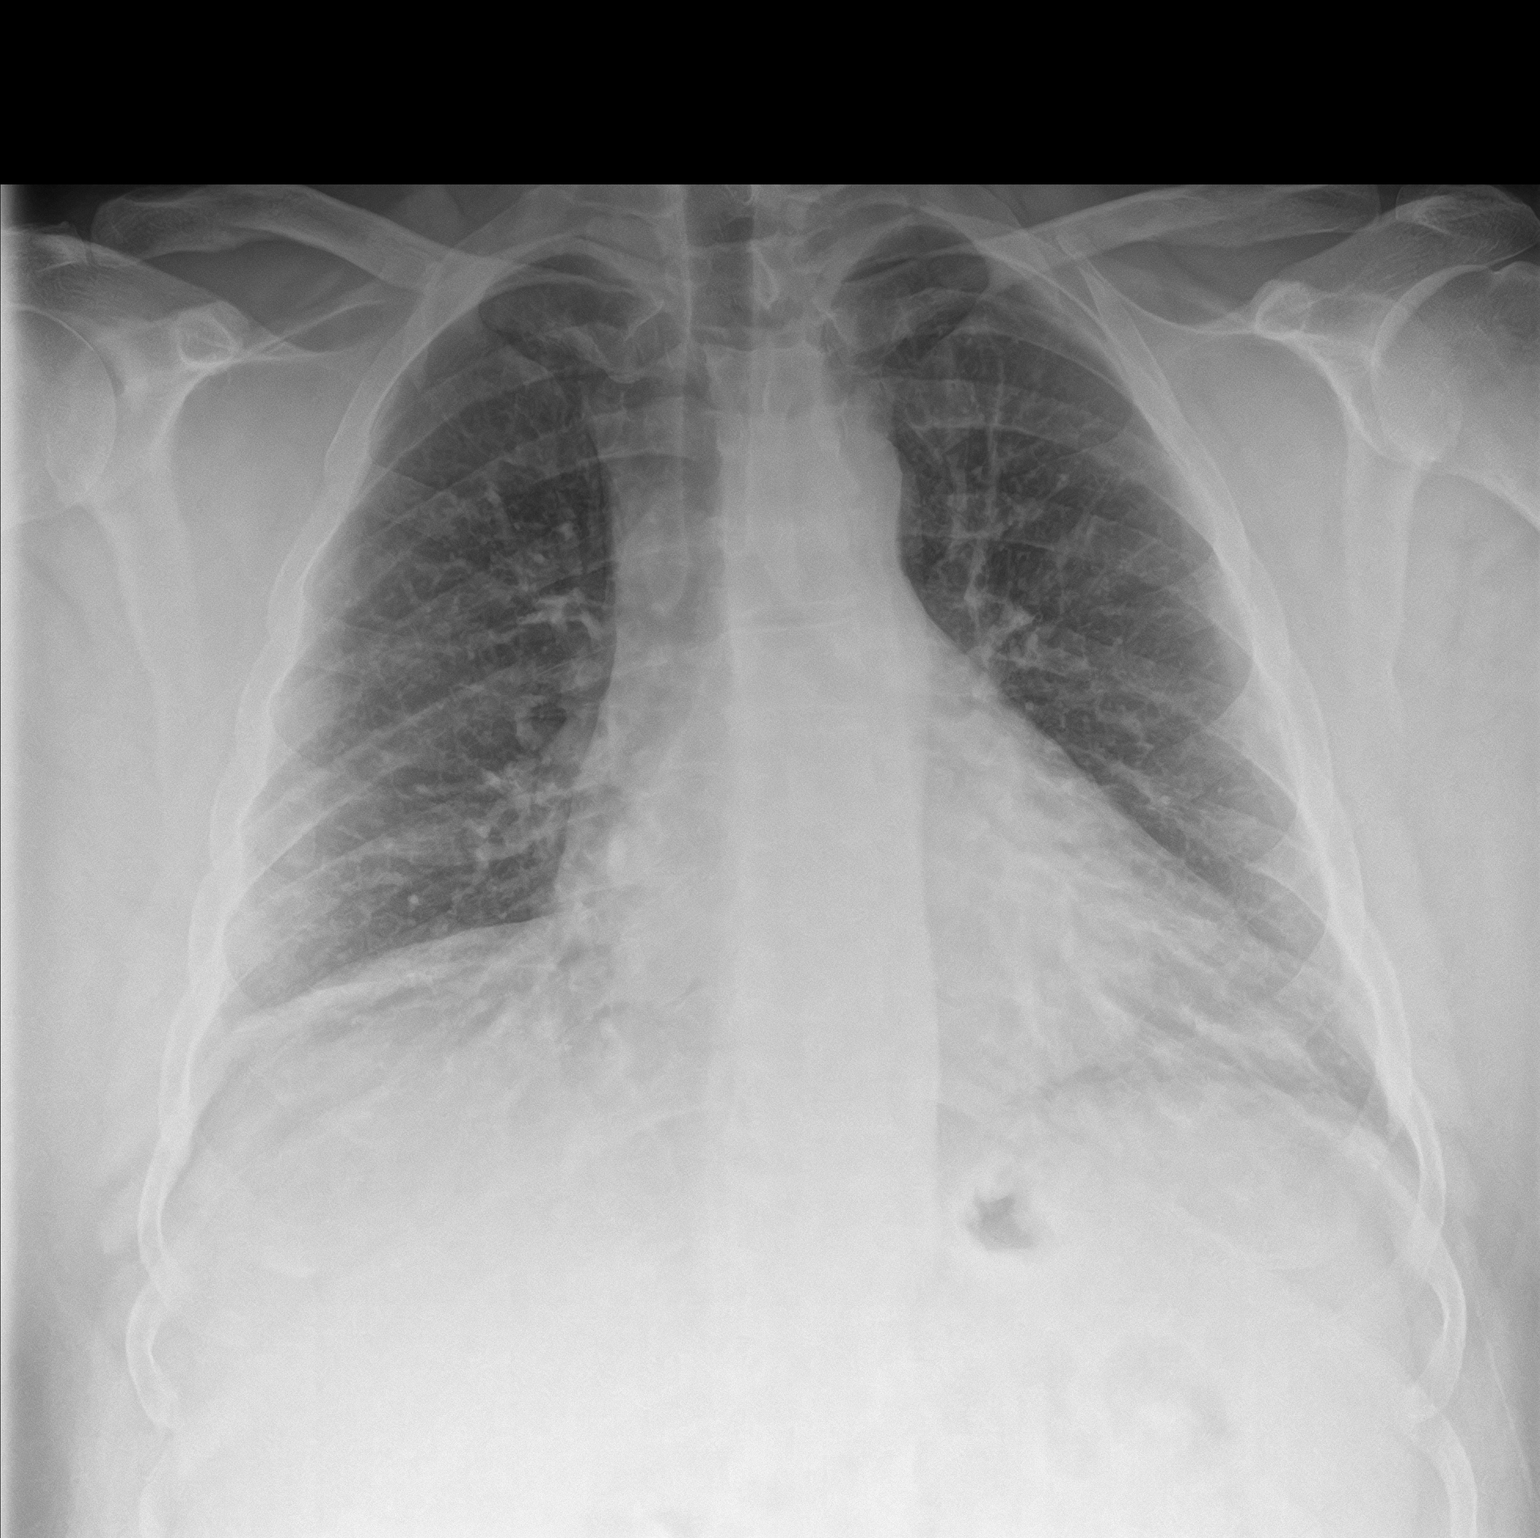
[im 2/2]
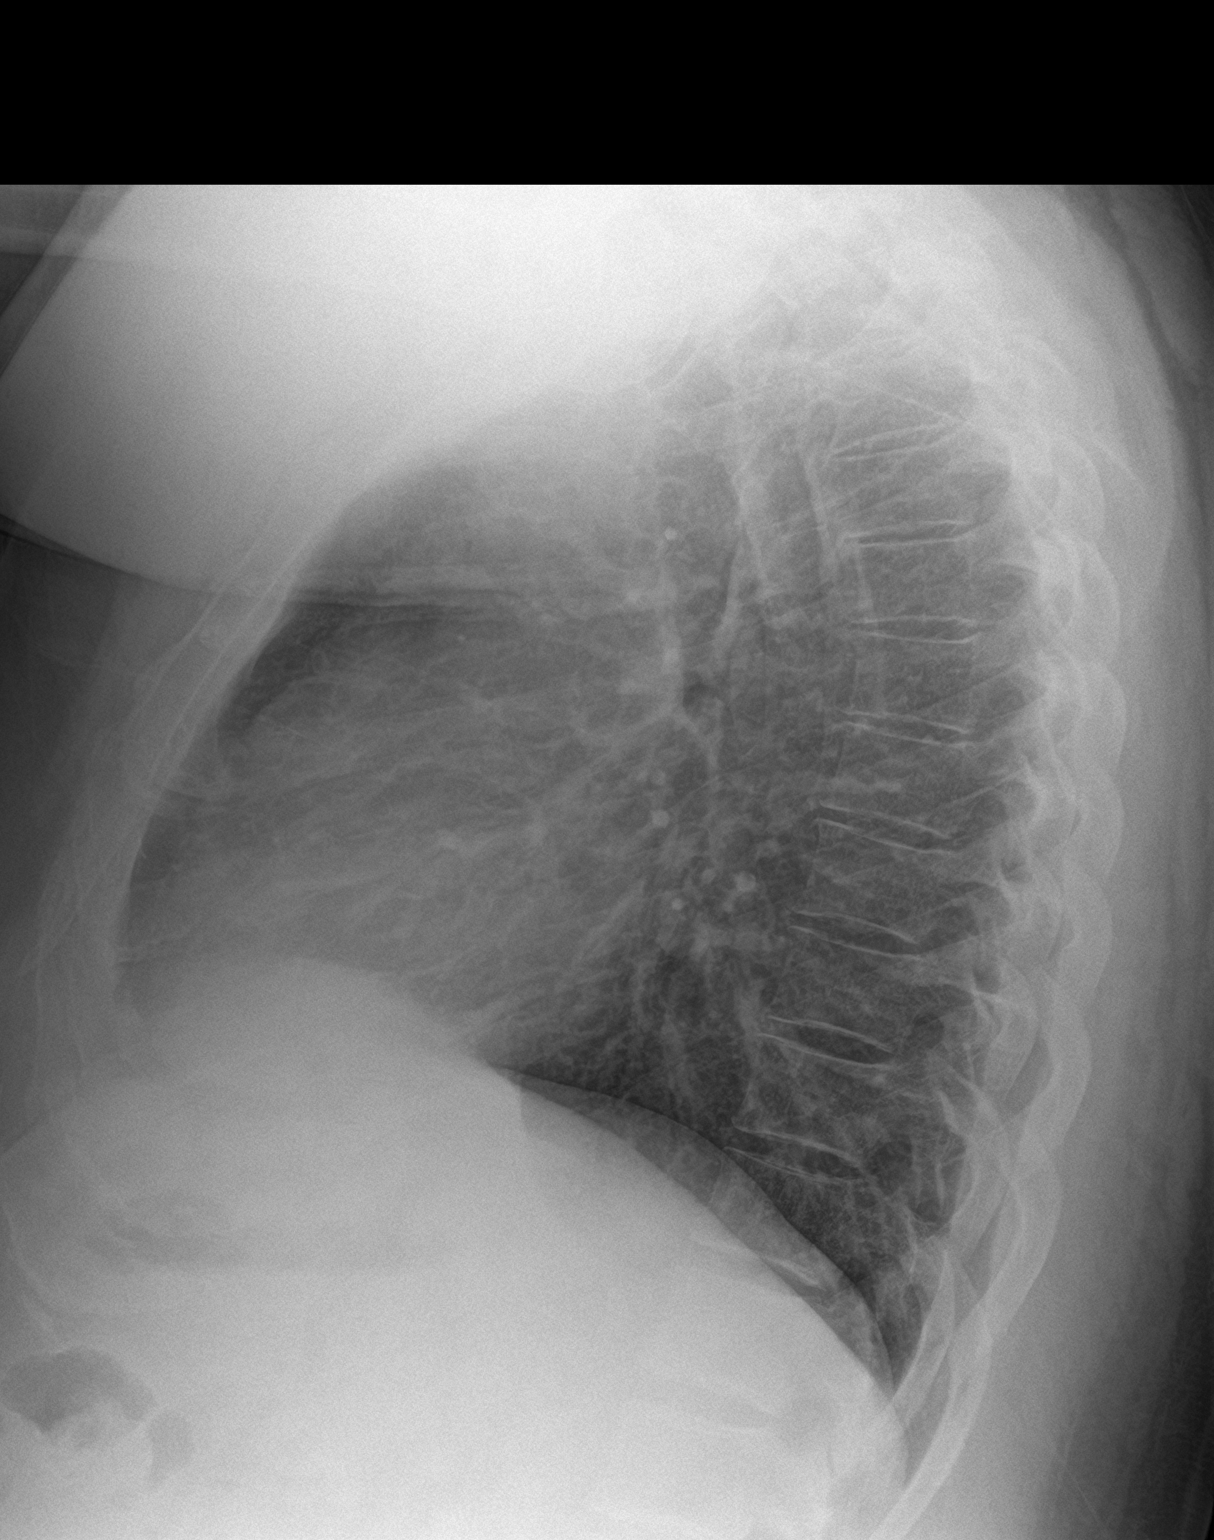

[2 of 2 positions shown; findings below may reference images not displayed]

FINDINGS: No edema or consolidation. Heart size and pulmonary vascularity are
within normal limits. No adenopathy. No bone lesions.
IMPRESSION: No edema or consolidation.  No evident adenopathy.
